# Patient Record
Sex: Female | Born: 1965 | Race: Black or African American | Hispanic: No | Smoking: Former smoker
Health system: Southern US, Community
[De-identification: ages and names within clinical notes are randomized; demographics above are authoritative.]

## PROBLEM LIST (undated history)

## (undated) DIAGNOSIS — F419 Anxiety disorder, unspecified: Secondary | ICD-10-CM

## (undated) DIAGNOSIS — I10 Essential (primary) hypertension: Secondary | ICD-10-CM

## (undated) DIAGNOSIS — F32A Depression, unspecified: Secondary | ICD-10-CM

## (undated) HISTORY — DX: Depression, unspecified: F32.A

## (undated) HISTORY — DX: Anxiety disorder, unspecified: F41.9

## (undated) HISTORY — PX: BACK SURGERY: SHX140

---

## 2021-07-03 ENCOUNTER — Encounter (HOSPITAL_COMMUNITY): Payer: Self-pay | Admitting: Emergency Medicine

## 2021-07-03 ENCOUNTER — Emergency Department (HOSPITAL_COMMUNITY)
Admission: EM | Admit: 2021-07-03 | Discharge: 2021-07-04 | Disposition: A | Discharge status: Home / Self Care | Payer: Medicaid Other | Attending: Student | Admitting: Student

## 2021-07-03 DIAGNOSIS — M542 Cervicalgia: Secondary | ICD-10-CM | POA: Diagnosis not present

## 2021-07-03 DIAGNOSIS — Z5321 Procedure and treatment not carried out due to patient leaving prior to being seen by health care provider: Secondary | ICD-10-CM | POA: Insufficient documentation

## 2021-07-03 DIAGNOSIS — N898 Other specified noninflammatory disorders of vagina: Secondary | ICD-10-CM | POA: Diagnosis present

## 2021-07-03 DIAGNOSIS — M545 Low back pain, unspecified: Secondary | ICD-10-CM | POA: Insufficient documentation

## 2021-07-03 DIAGNOSIS — G8929 Other chronic pain: Secondary | ICD-10-CM | POA: Diagnosis not present

## 2021-07-03 HISTORY — DX: Essential (primary) hypertension: I10

## 2021-07-03 LAB — URINALYSIS, ROUTINE W REFLEX MICROSCOPIC
Bilirubin Urine: NEGATIVE
Glucose, UA: NEGATIVE mg/dL
Hgb urine dipstick: NEGATIVE
Ketones, ur: NEGATIVE mg/dL
Nitrite: NEGATIVE
Protein, ur: NEGATIVE mg/dL
Specific Gravity, Urine: 1.006 (ref 1.005–1.030)
pH: 6 (ref 5.0–8.0)

## 2021-07-03 NOTE — ED Triage Notes (Signed)
Pt reports vaginal discharge and irration for a few days. Also having chronic back and neck pain. Pt recently moved and ran out of medications. Pt ambulatory to triage with walker.

## 2021-07-03 NOTE — ED Notes (Signed)
Pt left AMA °

## 2021-07-03 NOTE — ED Provider Notes (Signed)
Emergency Medicine Provider Triage Evaluation Note  Lori Ryan , a 55 y.o. female  was evaluated in triage.  Pt complains of 3 days of vaginal discharge and itching.  She is not sexually active.  Additionally she reports worsening of her chronic back and neck pain as she has been augmenting her medication.  She recently moved here from out of the area and has not yet found a primary care doctor to refill her medications..  Review of Systems  Positive: Vaginal discharge and itching, chronic back pain Negative: Fever, chills, nausea, vomiting,  Physical Exam  BP (!) 143/92 (BP Location: Left Arm)   Pulse 74   Temp 97.6 F (36.4 C) (Oral)   Resp 18   SpO2 100%  Gen:   Awake, no distress   Resp:  Normal effort  MSK:   Moves extremities without difficulty  Other:  Abdomen soft, nondistended, nontender. RRR no m/r/g  Medical Decision Making  Medically screening exam initiated at 4:01 PM.  Appropriate orders placed.  Lori Ryan was informed that the remainder of the evaluation will be completed by another provider, this initial triage assessment does not replace that evaluation, and the importance of remaining in the ED until their evaluation is complete.  This chart was dictated using voice recognition software, Dragon. Despite the best efforts of this provider to proofread and correct errors, errors may still occur which can change documentation meaning.    Sherrilee Gilles 07/03/21 1620    Tegeler, Canary Brim, MD 07/03/21 [phone]

## 2021-07-04 LAB — URINE CULTURE: Culture: NO GROWTH

## 2021-07-06 ENCOUNTER — Other Ambulatory Visit: Payer: Self-pay

## 2021-07-06 ENCOUNTER — Ambulatory Visit (HOSPITAL_COMMUNITY)
Admission: EM | Admit: 2021-07-06 | Discharge: 2021-07-06 | Disposition: A | Discharge status: Home / Self Care | Payer: Medicaid Other | Attending: Family Medicine | Admitting: Family Medicine

## 2021-07-06 ENCOUNTER — Encounter (HOSPITAL_COMMUNITY): Payer: Self-pay

## 2021-07-06 DIAGNOSIS — F32A Depression, unspecified: Secondary | ICD-10-CM | POA: Diagnosis present

## 2021-07-06 DIAGNOSIS — I10 Essential (primary) hypertension: Secondary | ICD-10-CM | POA: Diagnosis present

## 2021-07-06 DIAGNOSIS — N898 Other specified noninflammatory disorders of vagina: Secondary | ICD-10-CM | POA: Insufficient documentation

## 2021-07-06 DIAGNOSIS — G8929 Other chronic pain: Secondary | ICD-10-CM | POA: Insufficient documentation

## 2021-07-06 DIAGNOSIS — R350 Frequency of micturition: Secondary | ICD-10-CM | POA: Insufficient documentation

## 2021-07-06 DIAGNOSIS — M545 Low back pain, unspecified: Secondary | ICD-10-CM | POA: Diagnosis present

## 2021-07-06 LAB — POCT URINALYSIS DIPSTICK, ED / UC
Bilirubin Urine: NEGATIVE
Glucose, UA: NEGATIVE mg/dL
Hgb urine dipstick: NEGATIVE
Ketones, ur: NEGATIVE mg/dL
Nitrite: NEGATIVE
Protein, ur: NEGATIVE mg/dL
Specific Gravity, Urine: 1.01 (ref 1.005–1.030)
Urobilinogen, UA: 1 mg/dL (ref 0.0–1.0)
pH: 7 (ref 5.0–8.0)

## 2021-07-06 MED ORDER — LIDOCAINE 5 % EX PTCH: 1.0000 | MEDICATED_PATCH | CUTANEOUS | 2 refills | Status: AC

## 2021-07-06 MED ORDER — HYDROCHLOROTHIAZIDE 25 MG PO TABS: 25.0000 mg | ORAL_TABLET | Freq: Every day | ORAL | 0 refills | Status: AC

## 2021-07-06 MED ORDER — PREGABALIN 75 MG PO CAPS: 75.0000 mg | ORAL_CAPSULE | Freq: Two times a day (BID) | ORAL | 0 refills | Status: AC

## 2021-07-06 MED ORDER — METRONIDAZOLE 500 MG PO TABS: 500.0000 mg | ORAL_TABLET | Freq: Two times a day (BID) | ORAL | 0 refills | Status: AC

## 2021-07-06 MED ORDER — METHOCARBAMOL 750 MG PO TABS: 750.0000 mg | ORAL_TABLET | Freq: Three times a day (TID) | ORAL | 0 refills | Status: DC | PRN

## 2021-07-06 MED ORDER — PAROXETINE HCL 30 MG PO TABS: 30.0000 mg | ORAL_TABLET | Freq: Every day | ORAL | 0 refills | Status: AC

## 2021-07-06 MED ORDER — ACETAMINOPHEN 500 MG PO TABS: 1000.0000 mg | ORAL_TABLET | Freq: Three times a day (TID) | ORAL | 0 refills | Status: AC | PRN

## 2021-07-06 NOTE — ED Provider Notes (Signed)
[facility]   [id number] 07/06/21 Arrival Time: 1411  ASSESSMENT & PLAN:  1. Vaginal discharge   2. Urinary frequency   3. Chronic bilateral low back pain without sciatica   4. Elevated blood pressure reading with diagnosis of hypertension   5. Depression, unspecified depression type    Will treat empirically for BV. Begin: Meds ordered this encounter  Medications   metroNIDAZOLE (FLAGYL) 500 MG tablet    Sig: Take 1 tablet (500 mg total) by mouth 2 (two) times daily.    Dispense:  14 tablet    Refill:  0     Discharge Instructions      We have sent testing for causes of vaginal infections. We will notify you of any positive results once they are received. If required, we will prescribe any medications you might need.  Please refrain from all sexual activity for at least the next seven days.     Without s/s of PID.  Labs Reviewed  POCT URINALYSIS DIPSTICK, ED / UC - Abnormal; Notable for the following components:      Result Value   Leukocytes,Ua LARGE (*)    All other components within normal limits  URINE CULTURE  CERVICOVAGINAL ANCILLARY ONLY   Acute exacerbation of chronic back pain and HTN. Begin: Meds ordered this encounter  Medications   pregabalin (LYRICA) 75 MG capsule    Sig: Take 1 capsule (75 mg total) by mouth 2 (two) times daily.    Dispense:  180 capsule    Refill:  0   PARoxetine (PAXIL) 30 MG tablet    Sig: Take 1 tablet (30 mg total) by mouth daily.    Dispense:  90 tablet    Refill:  0   hydrochlorothiazide (HYDRODIURIL) 25 MG tablet    Sig: Take 1 tablet (25 mg total) by mouth daily.    Dispense:  90 tablet    Refill:  0   methocarbamol (ROBAXIN-750) 750 MG tablet    Sig: Take 1 tablet (750 mg total) by mouth every 8 (eight) hours as needed for muscle spasms.    Dispense:  90 tablet    Refill:  0   lidocaine (LIDODERM) 5 %    Sig: Place 1 patch onto the skin daily. Remove & Discard patch within 12 hours or as directed by  MD    Dispense:  30 patch    Refill:  2   acetaminophen (TYLENOL) 500 MG tablet    Sig: Take 2 tablets (1,000 mg total) by mouth every 8 (eight) hours as needed.    Dispense:  90 tablet    Refill:  0    Reviewed expectations re: course of current medical issues. Questions answered. Outlined signs and symptoms indicating need for more acute intervention. Patient verbalized understanding. After Visit Summary given.   SUBJECTIVE:  Lori Ryan is a 55 y.o. female who presents with complaint of vaginal discharge. Onset gradual. First noticed  3-4 d ago . Describes discharge as thin and clear; without odor. No specific aggravating or alleviating factors reported. Denies: dysuria; mild urinary freq. Afebrile. No abdominal or pelvic pain. Normal PO intake wihout n/v. No genital rashes or lesions. Reports that she is not sexually active. OTC treatment: none.  With chronic back pain; acute exacerbation; sev days. No trauma. Low back is more sore than usual.  Increased blood pressure noted today. Reports that she is treated for HTN. She reports taking medications as instructed, no chest pain on exertion, no dyspnea on  exertion, no swelling of ankles, no orthostatic dizziness or lightheadedness, no orthopnea or paroxysmal nocturnal dyspnea, and no palpitations.  No LMP recorded. Patient has had a hysterectomy.  Also needs refill of paroxetine.  OBJECTIVE:  Vitals:   07/06/21 1612  BP: (!) 153/87  Pulse: 69  Temp: 98.4 F (36.9 C)  TempSrc: Oral  SpO2: 100%     General appearance: alert, cooperative, appears stated age and no distress Lungs: unlabored respirations; speaks full sentences without difficulty Back: no CVA tenderness; FROM at waist CV: regular Resp: CTAB Abdomen: soft, non-tender Back: healed lower midline scar; vague "soreness" with exam; no midline TTP GU: deferred Skin: warm and dry Psychological: alert and cooperative; normal mood and affect.  Results for orders  placed or performed during the hospital encounter of 07/06/21  POC Urinalysis dipstick  Result Value Ref Range   Glucose, UA NEGATIVE NEGATIVE mg/dL   Bilirubin Urine NEGATIVE NEGATIVE   Ketones, ur NEGATIVE NEGATIVE mg/dL   Specific Gravity, Urine 1.010 1.005 - 1.030   Hgb urine dipstick NEGATIVE NEGATIVE   pH 7.0 5.0 - 8.0   Protein, ur NEGATIVE NEGATIVE mg/dL   Urobilinogen, UA 1.0 0.0 - 1.0 mg/dL   Nitrite NEGATIVE NEGATIVE   Leukocytes,Ua LARGE (A) NEGATIVE    Labs Reviewed  POCT URINALYSIS DIPSTICK, ED / UC - Abnormal; Notable for the following components:      Result Value   Leukocytes,Ua LARGE (*)    All other components within normal limits  URINE CULTURE  CERVICOVAGINAL ANCILLARY ONLY    Allergies  Allergen Reactions   Shellfish Allergy     Past Medical History:  Diagnosis Date   Hypertension    History reviewed. No pertinent family history. Social History   Socioeconomic History   Marital status: Widowed    Spouse name: Not on file   Number of children: Not on file   Years of education: Not on file   Highest education level: Not on file  Occupational History   Not on file  Tobacco Use   Smoking status: Former    Types: Cigarettes    Quit date: 2020    Years since quitting: 2.8   Smokeless tobacco: Never  Substance and Sexual Activity   Alcohol use: Never   Drug use: Never   Sexual activity: Not Currently  Other Topics Concern   Not on file  Social History Narrative   Not on file   Social Determinants of Health   Financial Resource Strain: Not on file  Food Insecurity: Not on file  Transportation Needs: Not on file  Physical Activity: Not on file  Stress: Not on file  Social Connections: Not on file  Intimate Partner Violence: Not on file           Mardella Layman, MD 07/06/21 [phone]

## 2021-07-06 NOTE — Discharge Instructions (Signed)
We have sent testing for causes of vaginal infections. We will notify you of any positive results once they are received. If required, we will prescribe any medications you might need.  Please refrain from all sexual activity for at least the next seven days.  

## 2021-07-06 NOTE — ED Triage Notes (Signed)
Pt is c/o of vaginal itching, frequent urination, vaginal discharge, for the past 4 days; pt states that she she was staying at a shelter with 23 women and believes that she got an infection there; pt is also asking for med refills

## 2021-07-07 LAB — CERVICOVAGINAL ANCILLARY ONLY
Bacterial Vaginitis (gardnerella): POSITIVE — AB
Candida Glabrata: NEGATIVE
Candida Vaginitis: NEGATIVE
Chlamydia: NEGATIVE
Comment: NEGATIVE
Comment: NEGATIVE
Comment: NEGATIVE
Comment: NEGATIVE
Comment: NEGATIVE
Comment: NORMAL
Neisseria Gonorrhea: NEGATIVE
Trichomonas: POSITIVE — AB

## 2021-07-07 LAB — URINE CULTURE: Culture: NO GROWTH

## 2022-01-23 ENCOUNTER — Encounter: Payer: Self-pay | Admitting: Physical Medicine & Rehabilitation

## 2022-02-19 ENCOUNTER — Encounter: Payer: Medicare Other | Attending: Physical Medicine & Rehabilitation | Admitting: Physical Medicine & Rehabilitation

## 2022-02-19 ENCOUNTER — Other Ambulatory Visit: Payer: Self-pay | Admitting: Registered Nurse

## 2022-02-19 ENCOUNTER — Ambulatory Visit
Admission: RE | Admit: 2022-02-19 | Discharge: 2022-02-19 | Disposition: A | Discharge status: Home / Self Care | Payer: Medicare Other | Source: Ambulatory Visit | Attending: Registered Nurse | Admitting: Registered Nurse

## 2022-02-19 DIAGNOSIS — M25551 Pain in right hip: Secondary | ICD-10-CM

## 2022-07-30 ENCOUNTER — Other Ambulatory Visit: Payer: Self-pay

## 2022-07-30 ENCOUNTER — Emergency Department (HOSPITAL_COMMUNITY): Payer: Medicare (Managed Care)

## 2022-07-30 ENCOUNTER — Encounter (HOSPITAL_COMMUNITY): Payer: Self-pay | Admitting: Emergency Medicine

## 2022-07-30 ENCOUNTER — Emergency Department (HOSPITAL_COMMUNITY)
Admission: EM | Admit: 2022-07-30 | Discharge: 2022-07-30 | Disposition: A | Discharge status: Home / Self Care | Payer: Medicare (Managed Care) | Attending: Emergency Medicine | Admitting: Emergency Medicine

## 2022-07-30 DIAGNOSIS — I10 Essential (primary) hypertension: Secondary | ICD-10-CM | POA: Diagnosis not present

## 2022-07-30 DIAGNOSIS — Z79899 Other long term (current) drug therapy: Secondary | ICD-10-CM | POA: Insufficient documentation

## 2022-07-30 DIAGNOSIS — M542 Cervicalgia: Secondary | ICD-10-CM | POA: Insufficient documentation

## 2022-07-30 DIAGNOSIS — M545 Low back pain, unspecified: Secondary | ICD-10-CM | POA: Diagnosis not present

## 2022-07-30 DIAGNOSIS — W108XXA Fall (on) (from) other stairs and steps, initial encounter: Secondary | ICD-10-CM | POA: Insufficient documentation

## 2022-07-30 DIAGNOSIS — M1712 Unilateral primary osteoarthritis, left knee: Secondary | ICD-10-CM | POA: Insufficient documentation

## 2022-07-30 DIAGNOSIS — Y990 Civilian activity done for income or pay: Secondary | ICD-10-CM | POA: Diagnosis not present

## 2022-07-30 DIAGNOSIS — W19XXXA Unspecified fall, initial encounter: Secondary | ICD-10-CM

## 2022-07-30 MED ORDER — HYDROCODONE-ACETAMINOPHEN 7.5-300 MG PO TABS: 1.0000 | ORAL_TABLET | Freq: Four times a day (QID) | ORAL | 0 refills | Status: DC | PRN

## 2022-07-30 MED ORDER — IBUPROFEN 600 MG PO TABS: 600.0000 mg | ORAL_TABLET | Freq: Four times a day (QID) | ORAL | 0 refills | Status: AC | PRN

## 2022-07-30 MED ORDER — METHOCARBAMOL 750 MG PO TABS: 750.0000 mg | ORAL_TABLET | Freq: Three times a day (TID) | ORAL | 0 refills | Status: AC | PRN

## 2022-07-30 MED ORDER — HYDROCODONE-ACETAMINOPHEN 7.5-300 MG PO TABS: 1.0000 | ORAL_TABLET | Freq: Four times a day (QID) | ORAL | 0 refills | Status: AC | PRN

## 2022-07-30 MED ORDER — KETOROLAC TROMETHAMINE 30 MG/ML IJ SOLN
30.0000 mg | Freq: Once | INTRAMUSCULAR | Status: AC
  Administered 2022-07-30: 30 mg via INTRAMUSCULAR
  Filled 2022-07-30: qty 1

## 2022-07-30 NOTE — ED Provider Notes (Signed)
[facility] EMERGENCY DEPARTMENT Provider Note   CSN: [id number] Arrival date & time: 07/30/22  7829     History  Chief Complaint  Patient presents with   Marletta Lor    Lori Ryan is a 56 y.o. female.  Pt is a 56 yo female with a hx of chronic pain, htn, and depression/anxiety.  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  However, she has a case manager who has found her somewhere else to stay.  So, her normal pain in her neck and in her back are getting worse.  She's been taking her chronic pain meds more and is running out.  She fell down some steps yesterday and has worsening pain in her neck and in her back.  She also has pain to her left knee.  She has been ambulatory and drove here.  Pt is in pain mgmt.  She denies any new neurologic sx.  She does have some intermittent right arm pain, but that has been chronic since she had neck surgery.       Home Medications Prior to Admission medications   Medication Sig Start Date End Date Taking? Authorizing Provider  HYDROcodone-Acetaminophen 7.5-300 MG TABS Take 1 tablet by mouth every 6 (six) hours as needed. 07/30/22  Yes Jacalyn Lefevre, MD  ibuprofen (ADVIL) 600 MG tablet Take 1 tablet (600 mg total) by mouth every 6 (six) hours as needed. 07/30/22  Yes Jacalyn Lefevre, MD  acetaminophen (TYLENOL) 500 MG tablet Take 2 tablets (1,000 mg total) by mouth every 8 (eight) hours as needed. 07/06/21   Mardella Layman, MD  hydrochlorothiazide (HYDRODIURIL) 25 MG tablet Take 1 tablet (25 mg total) by mouth daily. 07/06/21   Mardella Layman, MD  lidocaine (LIDODERM) 5 % Place 1 patch onto the skin daily. Remove & Discard patch within 12 hours or as directed by MD 07/06/21   Mardella Layman, MD  methocarbamol (ROBAXIN-750) 750 MG tablet Take 1 tablet (750 mg total) by mouth every 8 (eight) hours as needed for muscle spasms. 07/30/22   Jacalyn Lefevre, MD  metroNIDAZOLE (FLAGYL) 500 MG tablet Take 1 tablet (500 mg total) by mouth 2 (two) times daily. 07/06/21   Mardella Layman, MD  PARoxetine (PAXIL) 30 MG tablet Take 1 tablet (30 mg total) by mouth daily. 07/06/21   Mardella Layman, MD  pregabalin (LYRICA) 75 MG capsule Take 1 capsule (75 mg total) by mouth 2 (two) times daily. 07/06/21   Mardella Layman, MD      Allergies    Shellfish allergy    Review of Systems   Review of Systems  Musculoskeletal:  Positive for back pain and neck pain.  All other systems reviewed and are negative.   Physical Exam Updated Vital Signs BP 134/79 (BP Location: Right Arm)   Pulse 61   Temp 97.7 F (36.5 C) (Oral)   Resp 18   Ht 4\' 11"  (1.499 m)   Wt 55.3 kg   SpO2 100%   BMI 24.64 kg/m  Physical Exam Vitals and nursing note reviewed.  Constitutional:      Appearance: Normal appearance.  HENT:     Head: Normocephalic and atraumatic.     Right Ear: External ear normal.     Left Ear: External ear normal.     Nose: Nose normal.     Mouth/Throat:     Mouth: Mucous membranes are  moist.     Pharynx: Oropharynx is clear.  Eyes:     Extraocular Movements: Extraocular movements intact.     Conjunctiva/sclera: Conjunctivae normal.     Pupils: Pupils are equal, round, and reactive to light.  Neck:   Cardiovascular:     Rate and Rhythm: Normal rate and regular rhythm.     Pulses: Normal pulses.     Heart sounds: Normal heart sounds.  Pulmonary:     Effort: Pulmonary effort is normal.     Breath sounds: Normal breath sounds.  Abdominal:     General: Abdomen is flat. Bowel sounds are normal.     Palpations: Abdomen is soft.  Musculoskeletal:       Arms:     Cervical back: Normal range of motion and neck supple.       Legs:  Skin:    Capillary Refill: Capillary refill takes less than 2 seconds.  Neurological:     General: No focal deficit present.     Mental Status: She is alert and oriented to person, place, and time.   Psychiatric:        Mood and Affect: Mood normal.        Behavior: Behavior normal.     ED Results / Procedures / Treatments   Labs (all labs ordered are listed, but only abnormal results are displayed) Labs Reviewed - No data to display  EKG None  Radiology DG Lumbar Spine Complete  Result Date: 07/30/2022 CLINICAL DATA:  Pain EXAM: LUMBAR SPINE - COMPLETE 4+ VIEW COMPARISON:  None FINDINGS: Sacralized L5 with prior L3-L5 fusion. There is no evidence of lumbar spine fracture. There is mild degenerative disc disease with trace degenerative anterolisthesis at L2-L3. Mild facet arthropathy at L1-L2 and L2-L3. Calcification overlying the liver, likely granuloma. IMPRESSION: No evidence of lumbar spine fracture. Prior lower lumbar fusion without evidence of hardware complication. Mild degenerative disc disease and facet arthropathy above the fusion. Electronically Signed   By: Caprice Renshaw M.D.   On: 07/30/2022 09:23   DG Knee Complete 4 Views Left  Result Date: 07/30/2022 CLINICAL DATA:  Pain EXAM: LEFT KNEE - COMPLETE 4+ VIEW COMPARISON:  None Available. FINDINGS: There is no evidence of acute fracture. Alignment is normal. Tricompartment osteophyte formation with moderate medial compartment joint space narrowing. Suprapatellar enthesophyte. IMPRESSION: No evidence of acute fracture. Tricompartment osteoarthritis, worst in the medial compartment. Electronically Signed   By: Caprice Renshaw M.D.   On: 07/30/2022 09:20   DG Cervical Spine Complete  Result Date: 07/30/2022 CLINICAL DATA:  Fall EXAM: CERVICAL SPINE - COMPLETE 4 VIEW COMPARISON:  None Available. FINDINGS: ACDF of C5-C7. Hardware is intact with no significant perihardware lucency. No evidence of cervical spine fracture or prevertebral soft tissue swelling. Moderate degenerative disc disease at C3-C4, C4-C5, and C7-T1. Mild multilevel facet arthropathy. Alignment is normal. No other significant bone abnormalities are identified.  IMPRESSION: 1. ACDF of C5-C7 without evidence of hardware complication. 2. No radiographic evidence of acute fracture or traumatic malalignment Electronically Signed   By: Allegra Lai M.D.   On: 07/30/2022 09:19    Procedures Procedures    Medications Ordered in ED Medications  ketorolac (TORADOL) 30 MG/ML injection 30 mg (30 mg Intramuscular Given 07/30/22 9767)    ED Course/ Medical Decision Making/ A&P                           Medical Decision Making Amount and/or Complexity of  Data Reviewed Radiology: ordered.  Risk Prescription drug management.   This patient presents to the ED for concern of fall, this involves an extensive number of treatment options, and is a complaint that carries with it a high risk of complications and morbidity.  The differential diagnosis includes multiple trauma   Co morbidities that complicate the patient evaluation  chronic pain, htn, and depression/anxiety   Additional history obtained:  Additional history obtained from epic chart review  Imaging Studies ordered:  I ordered imaging studies including neck, back, knee  I independently visualized and interpreted imaging which showed  C-spine: ACDF of C5-C7 without evidence of hardware complication.  2. No radiographic evidence of acute fracture or traumatic  malalignment  L knee: No evidence of acute fracture.    Tricompartment osteoarthritis, worst in the medial compartment.  Lumbar: No evidence of lumbar spine fracture.    Prior lower lumbar fusion without evidence of hardware complication.    Mild degenerative disc disease and facet arthropathy above the  fusion.   I agree with the radiologist interpretation     Medicines ordered and prescription drug management:  I ordered medication including toradol  for pain  Reevaluation of the patient after these medicines showed that the patient improved I have reviewed the patients home medicines and have made adjustments  as needed   Problem List / ED Course:  Chronic pain exacerbated by activity and fall:  pt requests a refill of her pain meds as she does not have an appt until 12/1 with pain mgmt.  I told her that most pain mgmt places do not want other providers to prescribe the meds and may dismiss her.  (I don't know if this is the case, but it's a possibility).  She still wants some med for pain as she has fallen.  Pt given #10 hydrocodone 7.5s as this is what she's been taking.  I also gave her a rx for robaxin.  She is to f/u with her pain mgmt doc as scheduled. L knee pain:  this is a new pain.  She does have severe OA, so she is given the number to ortho.  She is to make an appt.    Reevaluation:  After the interventions noted above, I reevaluated the patient and found that they have :improved   Social Determinants of Health:  Lives at home   Dispostion:  After consideration of the diagnostic results and the patients response to treatment, I feel that the patent would benefit from discharge with outpatient f/u.          Final Clinical Impression(s) / ED Diagnoses Final diagnoses:  Fall, initial encounter  Bilateral low back pain without sciatica, unspecified chronicity  Osteoarthritis of left knee, unspecified osteoarthritis type  Neck pain    Rx / DC Orders ED Discharge Orders          Ordered    HYDROcodone-Acetaminophen 7.5-300 MG TABS  Every 6 hours PRN        07/30/22 0940    ibuprofen (ADVIL) 600 MG tablet  Every 6 hours PRN        07/30/22 0940    methocarbamol (ROBAXIN-750) 750 MG tablet  Every 8 hours PRN        07/30/22 0940              Jacalyn Lefevre, MD 07/30/22 0945

## 2022-07-30 NOTE — ED Triage Notes (Signed)
Pt fell yesterday at work down the last 2 steps. Endorses pain from waist down. Reports hx of back pain and a plate in her back.

## 2023-05-27 IMAGING — DX DG HIP (WITH OR WITHOUT PELVIS) 2-3V*R*
2 series · 2 of 2 positions shown · non-contrast
Comparison: None Available.

CLINICAL DATA: Right hip pain for 2+ weeks.

EXAM:
DG HIP (WITH OR WITHOUT PELVIS) 2-3V RIGHT

[dg hip unilat w or w/o pelvis 2-3 views  (1 of 2)]
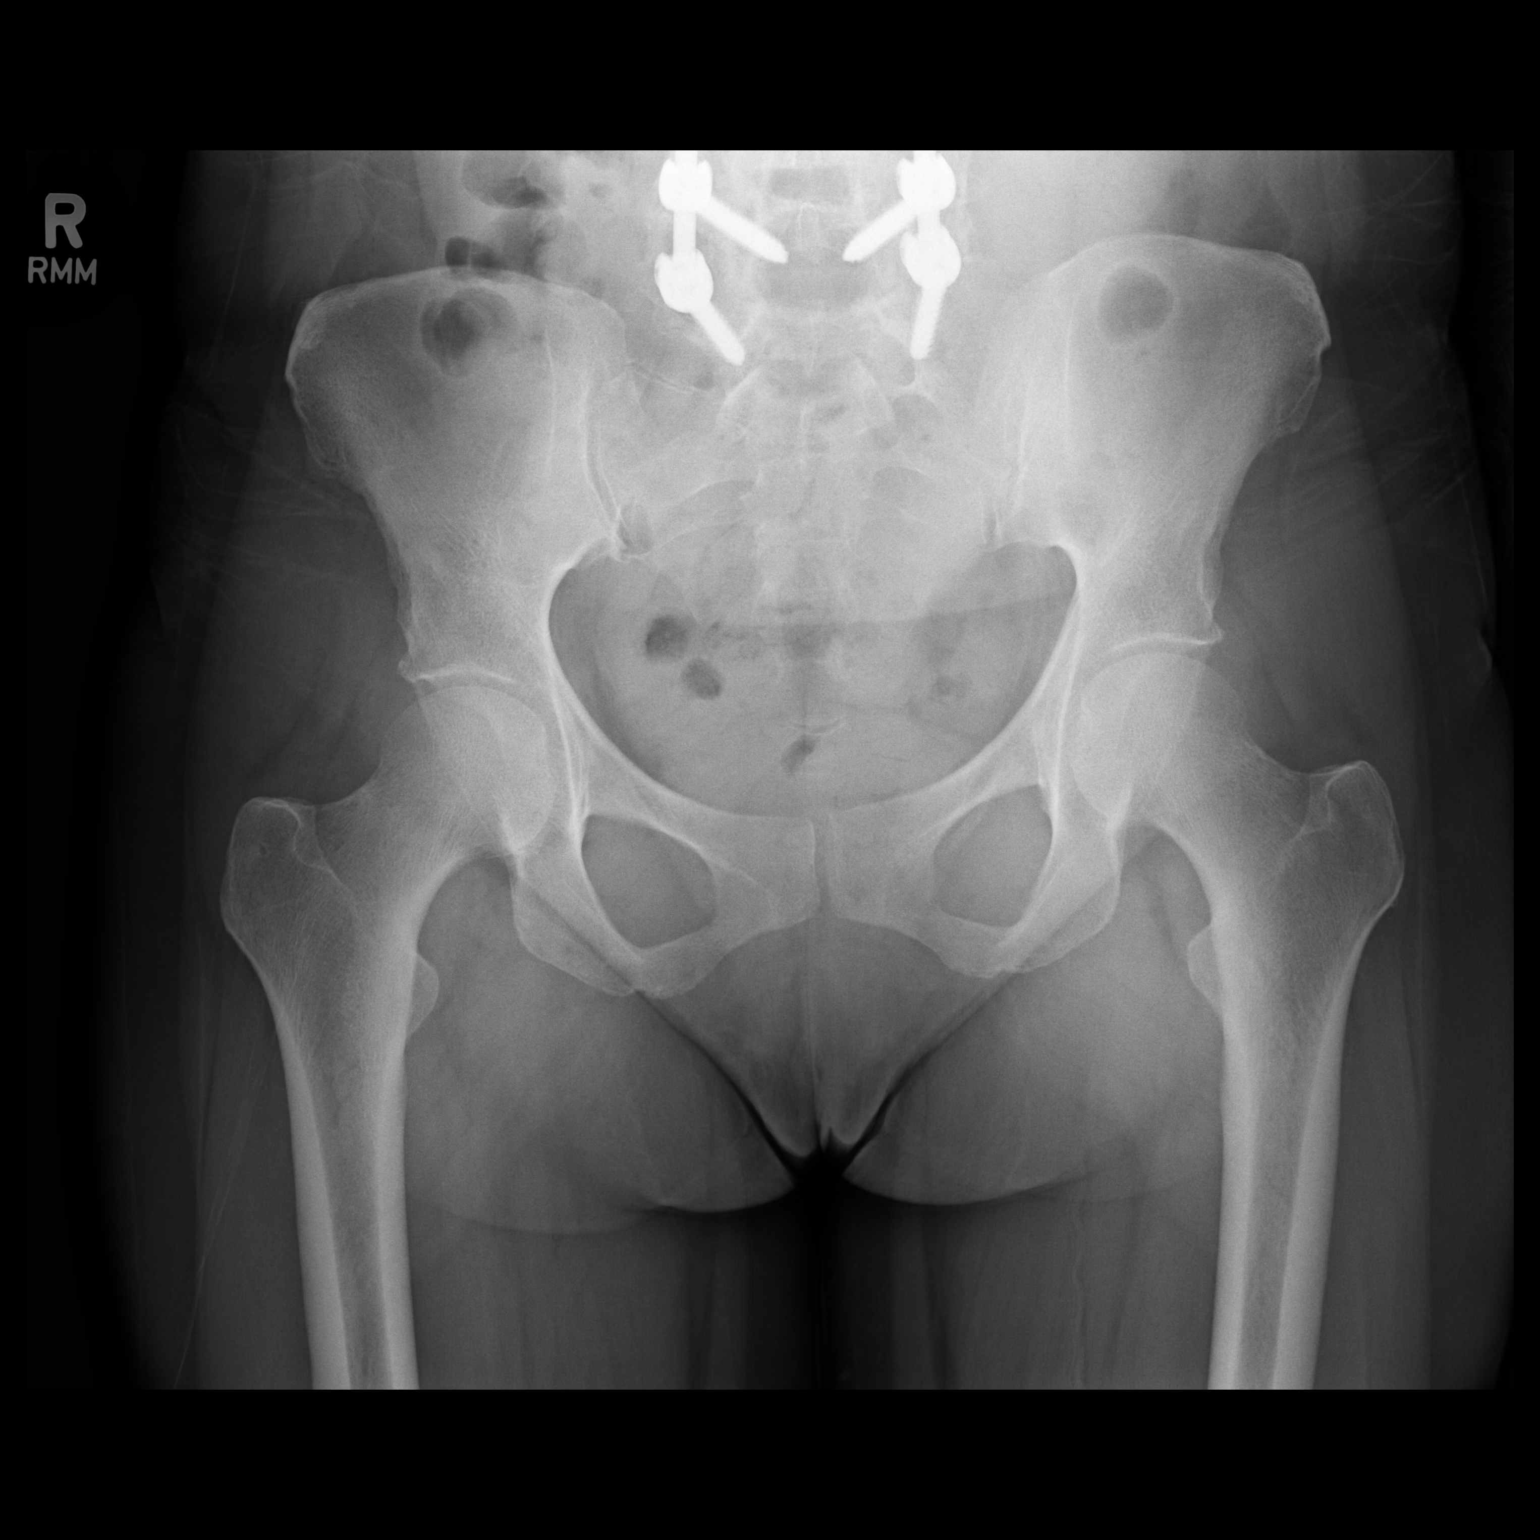

[dg hip unilat w or w/o pelvis 2-3 views  (2 of 2)]
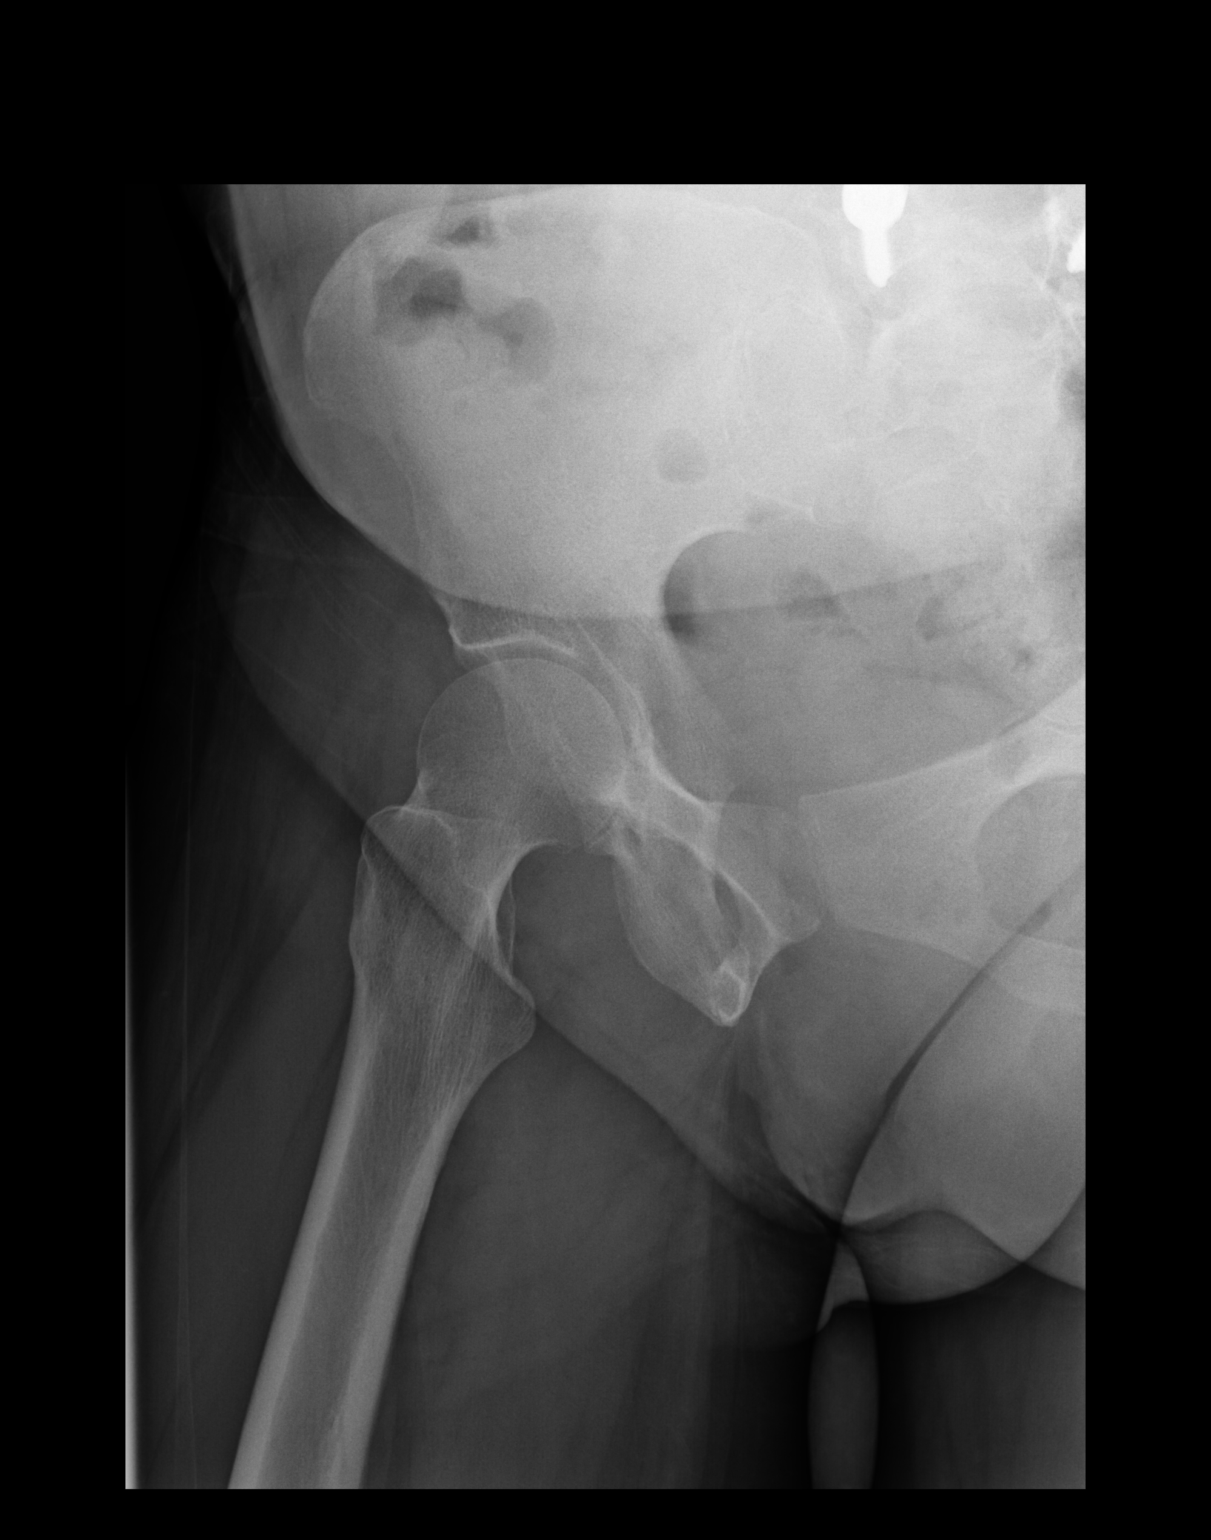

[2 of 2 positions shown; findings below may reference images not displayed]

FINDINGS: Partial visualization of a least L4-5 bilateral transpedicular rod
and screw fusion. The bilateral sacroiliac and the bilateral
femoroacetabular joint spaces are maintained. The pubic symphysis
joint space is maintained. No acute fracture or dislocation.
IMPRESSION: 1. No significant osteoarthritis of either hip.
2. No acute fracture.
